# Patient Record
Sex: Female | Born: 1985 | Race: White | Hispanic: No | Marital: Single | Smoking: Never smoker
Health system: Southern US, Community
[De-identification: ages and names within clinical notes are randomized; demographics above are authoritative.]

## PROBLEM LIST (undated history)

## (undated) DIAGNOSIS — Z8719 Personal history of other diseases of the digestive system: Secondary | ICD-10-CM

## (undated) DIAGNOSIS — R112 Nausea with vomiting, unspecified: Secondary | ICD-10-CM

## (undated) DIAGNOSIS — Z9889 Other specified postprocedural states: Secondary | ICD-10-CM

## (undated) DIAGNOSIS — K6289 Other specified diseases of anus and rectum: Secondary | ICD-10-CM

## (undated) DIAGNOSIS — R110 Nausea: Secondary | ICD-10-CM

## (undated) DIAGNOSIS — G8929 Other chronic pain: Secondary | ICD-10-CM

## (undated) DIAGNOSIS — K219 Gastro-esophageal reflux disease without esophagitis: Secondary | ICD-10-CM

## (undated) DIAGNOSIS — K5010 Crohn's disease of large intestine without complications: Secondary | ICD-10-CM

## (undated) DIAGNOSIS — Z973 Presence of spectacles and contact lenses: Secondary | ICD-10-CM

---
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
## 2005-12-02 ENCOUNTER — Encounter: Admission: RE | Admit: 2005-12-02 | Discharge: 2005-12-02 | Payer: Self-pay | Admitting: Family Medicine

## 2009-05-29 ENCOUNTER — Other Ambulatory Visit: Admission: RE | Admit: 2009-05-29 | Discharge: 2009-05-29 | Payer: Self-pay | Admitting: Family Medicine

## 2012-06-05 ENCOUNTER — Other Ambulatory Visit: Payer: Self-pay | Admitting: Family Medicine

## 2012-06-05 DIAGNOSIS — R109 Unspecified abdominal pain: Secondary | ICD-10-CM

## 2012-06-08 ENCOUNTER — Ambulatory Visit
Admission: RE | Admit: 2012-06-08 | Discharge: 2012-06-08 | Disposition: A | Discharge status: Home / Self Care | Payer: Managed Care, Other (non HMO) | Source: Ambulatory Visit | Attending: Family Medicine | Admitting: Family Medicine

## 2012-06-08 DIAGNOSIS — R109 Unspecified abdominal pain: Secondary | ICD-10-CM

## 2012-12-05 ENCOUNTER — Other Ambulatory Visit (HOSPITAL_COMMUNITY)
Admission: RE | Admit: 2012-12-05 | Discharge: 2012-12-05 | Disposition: A | Discharge status: Home / Self Care | Payer: Managed Care, Other (non HMO) | Source: Ambulatory Visit | Attending: Family Medicine | Admitting: Family Medicine

## 2012-12-05 ENCOUNTER — Other Ambulatory Visit: Payer: Self-pay | Admitting: Family Medicine

## 2012-12-05 DIAGNOSIS — Z124 Encounter for screening for malignant neoplasm of cervix: Secondary | ICD-10-CM | POA: Insufficient documentation

## 2012-12-05 DIAGNOSIS — Z1151 Encounter for screening for human papillomavirus (HPV): Secondary | ICD-10-CM | POA: Insufficient documentation

## 2013-08-13 ENCOUNTER — Encounter (HOSPITAL_COMMUNITY): Payer: Self-pay | Admitting: Emergency Medicine

## 2013-08-13 ENCOUNTER — Emergency Department (HOSPITAL_COMMUNITY)
Admission: EM | Admit: 2013-08-13 | Discharge: 2013-08-13 | Disposition: A | Discharge status: Home / Self Care | Payer: Managed Care, Other (non HMO) | Attending: Emergency Medicine | Admitting: Emergency Medicine

## 2013-08-13 DIAGNOSIS — R142 Eructation: Secondary | ICD-10-CM | POA: Insufficient documentation

## 2013-08-13 DIAGNOSIS — R141 Gas pain: Secondary | ICD-10-CM | POA: Insufficient documentation

## 2013-08-13 DIAGNOSIS — R197 Diarrhea, unspecified: Secondary | ICD-10-CM

## 2013-08-13 DIAGNOSIS — Z3202 Encounter for pregnancy test, result negative: Secondary | ICD-10-CM | POA: Insufficient documentation

## 2013-08-13 LAB — CBC WITH DIFFERENTIAL/PLATELET
Basophils Relative: 0 % (ref 0–1)
HCT: 42 % (ref 36.0–46.0)
Hemoglobin: 14.3 g/dL (ref 12.0–15.0)
Lymphs Abs: 2.6 10*3/uL (ref 0.7–4.0)
MCHC: 34 g/dL (ref 30.0–36.0)
Monocytes Absolute: 0.6 10*3/uL (ref 0.1–1.0)
Monocytes Relative: 5 % (ref 3–12)
Neutro Abs: 8.4 10*3/uL — ABNORMAL HIGH (ref 1.7–7.7)
Neutrophils Relative %: 72 % (ref 43–77)
RBC: 4.72 MIL/uL (ref 3.87–5.11)

## 2013-08-13 LAB — COMPREHENSIVE METABOLIC PANEL
Albumin: 3.6 g/dL (ref 3.5–5.2)
Alkaline Phosphatase: 73 U/L (ref 39–117)
BUN: 12 mg/dL (ref 6–23)
CO2: 24 mEq/L (ref 19–32)
Chloride: 101 mEq/L (ref 96–112)
Creatinine, Ser: 0.97 mg/dL (ref 0.50–1.10)
GFR calc Af Amer: >90 mL/min (ref >90)
GFR calc non Af Amer: 80 mL/min — ABNORMAL LOW (ref >90)
Glucose, Bld: 101 mg/dL — ABNORMAL HIGH (ref 70–99)
Potassium: 3.9 mEq/L (ref 3.5–5.1)
Total Bilirubin: 0.3 mg/dL (ref 0.3–1.2)

## 2013-08-13 LAB — URINALYSIS, ROUTINE W REFLEX MICROSCOPIC
Bilirubin Urine: NEGATIVE
Ketones, ur: NEGATIVE mg/dL
Nitrite: NEGATIVE
Protein, ur: NEGATIVE mg/dL
Urobilinogen, UA: 0.2 mg/dL (ref 0.0–1.0)
pH: 5.5 (ref 5.0–8.0)

## 2013-08-13 LAB — LIPASE, BLOOD: Lipase: 20 U/L (ref 11–59)

## 2013-08-13 LAB — URINE MICROSCOPIC-ADD ON

## 2013-08-13 MED ORDER — ONDANSETRON HCL 4 MG/2ML IJ SOLN
4.0000 mg | INTRAMUSCULAR | Status: AC
  Administered 2013-08-13: 4 mg via INTRAVENOUS
  Filled 2013-08-13: qty 2

## 2013-08-13 MED ORDER — DICYCLOMINE HCL 20 MG PO TABS: 20.0000 mg | ORAL_TABLET | Freq: Two times a day (BID) | ORAL | Status: DC

## 2013-08-13 MED ORDER — MORPHINE SULFATE 4 MG/ML IJ SOLN
4.0000 mg | Freq: Once | INTRAMUSCULAR | Status: AC
  Administered 2013-08-13: 4 mg via INTRAVENOUS
  Filled 2013-08-13: qty 1

## 2013-08-13 MED ORDER — SODIUM CHLORIDE 0.9 % IV BOLUS (SEPSIS)
1000.0000 mL | Freq: Once | INTRAVENOUS | Status: AC
  Administered 2013-08-13: 1000 mL via INTRAVENOUS

## 2013-08-13 MED ORDER — ONDANSETRON 4 MG PO TBDP: 4.0000 mg | ORAL_TABLET | Freq: Three times a day (TID) | ORAL | Status: DC | PRN

## 2013-08-13 NOTE — ED Provider Notes (Signed)
CSN: [id number]     Arrival date & time 08/13/13  1019 History   First MD Initiated Contact with Patient 08/13/13 1117     Chief Complaint  Patient presents with  . Diarrhea  . Abdominal Pain   (Consider location/radiation/quality/duration/timing/severity/associated sxs/prior Treatment) The history is provided by the patient and medical records.   This is a 27 year old female with no significant past medical history presenting to the ED for 2 months of cramping abdominal pain, bloating, and diarrhea. Patient states symptoms have worsened over the past week. Patient has seen her PCP and had lab work performed, including stool O&P and culture, but has not resulted yet.  Patient denies any recent travel or antibiotic use.  Has had isolated episodes of nausea and vomiting, but none in the past 2 days. Patient states everything she seems to "run through her". States her stools are very watery with some mucus and blood intermixed.  No gross blood. No history of IBS or Crohn's disease.  Does have family hx of colon CA-- grandmother.  Has upcoming appt with GI on 08/23/13.  No urinary sx, no vaginal complaints.  Patient states she has had an intermittent, low-grade fever, afebrile on arrival 97.40F.  Notes some sweats when nausea comes but no chills.  History reviewed. No pertinent past medical history. History reviewed. No pertinent past surgical history. History reviewed. No pertinent family history. History  Substance Use Topics  . Smoking status: Never Smoker   . Smokeless tobacco: Not on file  . Alcohol Use: Yes     Comment: occ   OB History   Grav Para Term Preterm Abortions TAB SAB Ect Mult Living                 Review of Systems  Gastrointestinal: Positive for abdominal pain and diarrhea.  All other systems reviewed and are negative.    Allergies  Review of patient's allergies indicates no known allergies.  Home Medications  No current outpatient prescriptions on file. BP  130/68  Pulse 100  Temp(Src) 97.8 F (36.6 C) (Oral)  Resp 18  Ht 5\' 6"  (1.676 m)  Wt 190 lb 8 oz (86.41 kg)  BMI 30.76 kg/m2  SpO2 97%  Physical Exam  Nursing note and vitals reviewed. Constitutional: She is oriented to person, place, and time. She appears well-developed and well-nourished. No distress.  HENT:  Head: Normocephalic and atraumatic.  Mouth/Throat: Oropharynx is clear and moist.  Mildly dry mucus membranes  Eyes: Conjunctivae and EOM are normal. Pupils are equal, round, and reactive to light.  Neck: Normal range of motion. Neck supple.  Cardiovascular: Normal rate, regular rhythm and normal heart sounds.   Pulmonary/Chest: Effort normal and breath sounds normal. No respiratory distress. She has no wheezes.  Abdominal: Soft. Bowel sounds are normal. She exhibits no distension. There is tenderness in the epigastric area, left upper quadrant and left lower quadrant. There is no CVA tenderness, no tenderness at McBurney's point and negative Murphy's sign.  Abdomen soft, no distention, no peritoneal signs  Musculoskeletal: Normal range of motion.  Neurological: She is alert and oriented to person, place, and time.  Skin: Skin is warm. She is not diaphoretic.  Psychiatric: She has a normal mood and affect.    ED Course  Procedures (including critical care time) Labs Review Labs Reviewed  CBC WITH DIFFERENTIAL - Abnormal; Notable for the following:    WBC 11.8 (*)    Neutro Abs 8.4 (*)    All other components  within normal limits  COMPREHENSIVE METABOLIC PANEL - Abnormal; Notable for the following:    Glucose, Bld 101 (*)    GFR calc non Af Amer 80 (*)    All other components within normal limits  URINALYSIS, ROUTINE W REFLEX MICROSCOPIC - Abnormal; Notable for the following:    APPearance HAZY (*)    Hgb urine dipstick TRACE (*)    Leukocytes, UA SMALL (*)    All other components within normal limits  URINE MICROSCOPIC-ADD ON - Abnormal; Notable for the  following:    Squamous Epithelial / LPF MANY (*)    Bacteria, UA MANY (*)    All other components within normal limits  URINE CULTURE  LIPASE, BLOOD  POCT PREGNANCY, URINE   Imaging Review No results found.  EKG Interpretation   None       MDM   1. Diarrhea    Labs as above, mild leukocytosis at 11.8, H/H stable.  U/a contaminated, will send for culture.  Sx greatly improved after IVF and pain meds-- pt without any episodes of diarrhea in the ED, tolerating PO without difficulty.  Repeat abdominal exam largely benign without peritoneal signs.  Sx possibly due to colitis, IBS, or Chrohn's disease-- Doubt acute or surgical abdomen at this time.  Pt afebrile, non-toxic appearing, NAD, VS stable- ok for discharge.  Will give trial of bentyl and zofran.  FU with GI as previously scheduled on 08/23/13.   Instructed she may call [facility] for earlier appt-- contact info given.  Discussed plan with pt, she agreed.  Return precautions advised.  Discussed with Dr. Rhunette Croft who agrees with assessment and plan of care.  Garlon Hatchet, PA-C 08/13/13 1356

## 2013-08-13 NOTE — ED Notes (Signed)
Pt c/o abd pain and distention with mucous diarrhea with some blood x 1 week; pt sts seen PCP but no diagnosis or improvement; pt sts has GI appt on 10/30

## 2013-08-13 NOTE — ED Notes (Signed)
Pt discharged.Vital signs stable and GCS 15.Discharge instructions given. 

## 2013-08-14 LAB — URINE CULTURE: Colony Count: 50000

## 2013-08-14 NOTE — ED Provider Notes (Signed)
Medical screening examination/treatment/procedure(s) were performed by non-physician practitioner and as supervising physician I was immediately available for consultation/collaboration.  Derwood Kaplan, MD 08/14/13 1559

## 2013-08-15 NOTE — ED Notes (Signed)
+   Urine No treatment needed per protocol MD. 

## 2013-08-16 ENCOUNTER — Other Ambulatory Visit: Payer: Self-pay | Admitting: Gastroenterology

## 2013-08-16 DIAGNOSIS — R109 Unspecified abdominal pain: Secondary | ICD-10-CM

## 2013-08-21 ENCOUNTER — Ambulatory Visit
Admission: RE | Admit: 2013-08-21 | Discharge: 2013-08-21 | Disposition: A | Discharge status: Home / Self Care | Payer: 59 | Source: Ambulatory Visit | Attending: Gastroenterology | Admitting: Gastroenterology

## 2013-08-21 DIAGNOSIS — R109 Unspecified abdominal pain: Secondary | ICD-10-CM

## 2013-08-21 MED ORDER — IOHEXOL 300 MG/ML  SOLN
100.0000 mL | Freq: Once | INTRAMUSCULAR | Status: AC | PRN
  Administered 2013-08-21: 100 mL via INTRAVENOUS

## 2013-08-25 HISTORY — PX: COLONOSCOPY WITH ESOPHAGOGASTRODUODENOSCOPY (EGD): SHX5779

## 2013-11-26 ENCOUNTER — Ambulatory Visit (HOSPITAL_COMMUNITY)
Admission: RE | Admit: 2013-11-26 | Discharge: 2013-11-26 | Disposition: A | Discharge status: Home / Self Care | Payer: 59 | Source: Ambulatory Visit | Attending: General Surgery | Admitting: General Surgery

## 2013-11-26 ENCOUNTER — Ambulatory Visit (INDEPENDENT_AMBULATORY_CARE_PROVIDER_SITE_OTHER): Payer: 59 | Admitting: General Surgery

## 2013-11-26 ENCOUNTER — Encounter (INDEPENDENT_AMBULATORY_CARE_PROVIDER_SITE_OTHER): Payer: Self-pay | Admitting: General Surgery

## 2013-11-26 ENCOUNTER — Encounter (INDEPENDENT_AMBULATORY_CARE_PROVIDER_SITE_OTHER): Payer: Self-pay

## 2013-11-26 VITALS — BP 126/86 | HR 68 | Temp 98.4°F | Resp 14 | Ht 66.0 in | Wt 187.2 lb

## 2013-11-26 DIAGNOSIS — K6289 Other specified diseases of anus and rectum: Secondary | ICD-10-CM | POA: Insufficient documentation

## 2013-11-26 DIAGNOSIS — K5090 Crohn's disease, unspecified, without complications: Secondary | ICD-10-CM | POA: Insufficient documentation

## 2013-11-26 DIAGNOSIS — K5010 Crohn's disease of large intestine without complications: Secondary | ICD-10-CM

## 2013-11-26 MED ORDER — METRONIDAZOLE 500 MG PO TABS: 500.0000 mg | ORAL_TABLET | Freq: Three times a day (TID) | ORAL | Status: DC

## 2013-11-26 MED ORDER — GADOBENATE DIMEGLUMINE 529 MG/ML IV SOLN
17.0000 mL | Freq: Once | INTRAVENOUS | Status: AC | PRN
  Administered 2013-11-26: 17 mL via INTRAVENOUS

## 2013-11-26 MED ORDER — LIDOCAINE 5 % EX OINT: 1.0000 "application " | TOPICAL_OINTMENT | CUTANEOUS | Status: DC | PRN

## 2013-11-26 NOTE — Progress Notes (Signed)
No chief complaint on file.   HISTORY: Vickie LefortSarah Vang is a 28 y.o. female who presents to the office with anal pain.  Other symptoms include bleeding (blood on TP).  This had been occurring for several weeks.  She has a recent diagnosis of Crohn's by Dr Ewing SchleinMagod.  She has tried sitz baths and steroid cream in the past with some success.  BM's makes the symptoms worse. She has 4-10 BM's a day.  It is continuous in nature.   Her last colonoscopy was in the fall where the Crohn's was identified.  She is on Aprezo (3.75mg ) and Uceris (9mg ).    No past medical history on file.    Past Surgical History  Procedure Laterality Date  . Tonsillectomy       Colonoscopy- ulcerated mucosa throughout entire colon.  TI normal.  Inflamed hemorrhoidal tissue.  Biopsies c/w Crohn's   Current Outpatient Prescriptions  Medication Sig Dispense Refill  . Budesonide (UCERIS) 9 MG TB24 Take by mouth.      Marland Kitchen. buPROPion (WELLBUTRIN XL) 150 MG 24 hr tablet Take 450 mg by mouth daily.      Marland Kitchen. FLUoxetine (PROZAC) 20 MG capsule Take 20 mg by mouth daily.      . mesalamine (APRISO) 0.375 G 24 hr capsule Take 375 mg by mouth daily.      . norethindrone-ethinyl estradiol 1/35 (ALAYCEN 1/35) tablet Take 1 tablet by mouth daily.      Marland Kitchen. omeprazole (PRILOSEC) 40 MG capsule Take 40 mg by mouth daily.       No current facility-administered medications for this visit.      No Known Allergies    Family History  Problem Relation Age of Onset  . Colon cancer Paternal Grandmother   . Ovarian cancer Maternal Grandmother     History   Social History  . Marital Status: Single    Spouse Name: N/A    Number of Children: N/A  . Years of Education: N/A   Social History Main Topics  . Smoking status: Never Smoker   . Smokeless tobacco: None  . Alcohol Use: Yes     Comment: occ  . Drug Use: No  . Sexual Activity: None   Other Topics Concern  . None   Social History Narrative  . None      REVIEW OF SYSTEMS - PERTINENT  POSITIVES ONLY: Review of Systems - General ROS: negative for - chills, fever or weight loss Hematological and Lymphatic ROS: negative for - bleeding problems, blood clots or bruising Respiratory ROS: no cough, shortness of breath, or wheezing Cardiovascular ROS: no chest pain or dyspnea on exertion Gastrointestinal ROS: no abdominal pain, change in bowel habits, or black or bloody stools Genito-Urinary ROS: no dysuria, trouble voiding, or hematuria  EXAM: Filed Vitals:   11/26/13 0950  BP: 126/86  Pulse: 68  Temp: 98.4 F (36.9 C)  Resp: 14    General appearance: alert and cooperative Resp: clear to auscultation bilaterally Cardio: regular rate and rhythm GI: normal findings: soft, non-tender  Anal Exam Findings: Left anterior inflamed skin tag, possible fissure, no abscess identified perianally. Pt with significant pain.  Unable to explain completely with exam findings  ASSESSMENT AND PLAN:  Vickie LefortSarah Vang is a 28 y.o. F with A new diagnosis of Crohn's disease. She presents to the office with 2 weeks of worsening perianal pain. She is having multiple loose bowel movements everyday. She is also having GI pain and cramping. On exam she has a  left anterior inflammatory skin tag and possibly a fissure. She is also complaining of pain deeper inside. I think it is reasonable to perform an MRI to rule out occult fistula or abscess.  I will place her on a course of Flagyl to help with her her anal inflammation. She will discuss her medication regimen with Dr. Ewing Schlein.  I'll see her back in the office in four-week source in her if needed.   Vanita Panda, MD Colon and Rectal Surgery / General Surgery [facility]      Visit Diagnoses: No diagnosis found.  Primary Care Physician: Gweneth Dimitri, MD

## 2013-11-26 NOTE — Patient Instructions (Signed)
We will get the MRI scheduled as soon as possible.  Take the flagyl as directed.  Call the office if you'd like a refill.  Ok to continue to use hemorrhoid cream 3-4 times a day.  Sit in a warm bath after bowel movements.  Keep the area clean and dry.  Use the lidocaine cream as needed to help with pain.

## 2013-11-27 ENCOUNTER — Telehealth (INDEPENDENT_AMBULATORY_CARE_PROVIDER_SITE_OTHER): Payer: Self-pay | Admitting: General Surgery

## 2013-11-27 NOTE — Telephone Encounter (Signed)
Called patient to let her know that she does not have an abscess or fistula. Most likely she has a small fissure that is aggravated by her multiple BM's. Cont current treatment. And her anal pain will get better when the crohn's is in better control. And the patient wants Dr Maisie Fushomas to send a note to Dr Ewing SchleinMagod

## 2013-11-28 NOTE — Telephone Encounter (Signed)
MRI and note sent to Dr Ewing Schlein.  Will be available as needed to help manage her symptoms.

## 2013-12-12 ENCOUNTER — Encounter (INDEPENDENT_AMBULATORY_CARE_PROVIDER_SITE_OTHER): Payer: Self-pay

## 2013-12-21 ENCOUNTER — Other Ambulatory Visit: Payer: Self-pay | Admitting: Gastroenterology

## 2013-12-21 DIAGNOSIS — R109 Unspecified abdominal pain: Secondary | ICD-10-CM

## 2013-12-21 DIAGNOSIS — K6289 Other specified diseases of anus and rectum: Secondary | ICD-10-CM

## 2013-12-24 ENCOUNTER — Ambulatory Visit
Admission: RE | Admit: 2013-12-24 | Discharge: 2013-12-24 | Disposition: A | Discharge status: Home / Self Care | Payer: 59 | Source: Ambulatory Visit | Attending: Gastroenterology | Admitting: Gastroenterology

## 2013-12-24 DIAGNOSIS — K6289 Other specified diseases of anus and rectum: Secondary | ICD-10-CM

## 2013-12-24 DIAGNOSIS — R109 Unspecified abdominal pain: Secondary | ICD-10-CM

## 2013-12-24 MED ORDER — IOHEXOL 300 MG/ML  SOLN
100.0000 mL | Freq: Once | INTRAMUSCULAR | Status: AC | PRN
  Administered 2013-12-24: 100 mL via INTRAVENOUS

## 2013-12-25 ENCOUNTER — Telehealth (INDEPENDENT_AMBULATORY_CARE_PROVIDER_SITE_OTHER): Payer: Self-pay | Admitting: General Surgery

## 2013-12-25 NOTE — Telephone Encounter (Signed)
Called to discuss possible EUA.  Left message.

## 2013-12-25 NOTE — Telephone Encounter (Signed)
Discuss surgery with patient. She is ready to schedule.

## 2013-12-27 ENCOUNTER — Encounter (HOSPITAL_BASED_OUTPATIENT_CLINIC_OR_DEPARTMENT_OTHER): Payer: Self-pay | Admitting: *Deleted

## 2013-12-31 ENCOUNTER — Encounter (HOSPITAL_BASED_OUTPATIENT_CLINIC_OR_DEPARTMENT_OTHER): Payer: Self-pay | Admitting: *Deleted

## 2013-12-31 NOTE — Progress Notes (Signed)
NPO AFTER MN. ARRIVE AT 1610. NEEDS HG AND URINE PREG. WILL TAKE AM MEDS DOS W/ SIPS OF WATER.

## 2014-01-01 ENCOUNTER — Telehealth (INDEPENDENT_AMBULATORY_CARE_PROVIDER_SITE_OTHER): Payer: Self-pay | Admitting: General Surgery

## 2014-01-01 NOTE — Telephone Encounter (Signed)
Calling to state they have no orders surgery tomorrow

## 2014-01-02 ENCOUNTER — Ambulatory Visit (HOSPITAL_BASED_OUTPATIENT_CLINIC_OR_DEPARTMENT_OTHER)
Admission: RE | Admit: 2014-01-02 | Discharge: 2014-01-02 | Disposition: A | Discharge status: Home / Self Care | Payer: 59 | Source: Ambulatory Visit | Attending: General Surgery | Admitting: General Surgery

## 2014-01-02 ENCOUNTER — Encounter (HOSPITAL_BASED_OUTPATIENT_CLINIC_OR_DEPARTMENT_OTHER)
Admission: RE | Disposition: A | Discharge status: Home / Self Care | Payer: Self-pay | Source: Ambulatory Visit | Attending: General Surgery

## 2014-01-02 ENCOUNTER — Ambulatory Visit (HOSPITAL_BASED_OUTPATIENT_CLINIC_OR_DEPARTMENT_OTHER): Payer: 59 | Admitting: Anesthesiology

## 2014-01-02 ENCOUNTER — Encounter (HOSPITAL_BASED_OUTPATIENT_CLINIC_OR_DEPARTMENT_OTHER): Payer: 59 | Admitting: Anesthesiology

## 2014-01-02 ENCOUNTER — Encounter (HOSPITAL_BASED_OUTPATIENT_CLINIC_OR_DEPARTMENT_OTHER): Payer: Self-pay | Admitting: Anesthesiology

## 2014-01-02 DIAGNOSIS — K449 Diaphragmatic hernia without obstruction or gangrene: Secondary | ICD-10-CM | POA: Insufficient documentation

## 2014-01-02 DIAGNOSIS — K626 Ulcer of anus and rectum: Secondary | ICD-10-CM | POA: Insufficient documentation

## 2014-01-02 DIAGNOSIS — K6289 Other specified diseases of anus and rectum: Secondary | ICD-10-CM | POA: Insufficient documentation

## 2014-01-02 DIAGNOSIS — Z79899 Other long term (current) drug therapy: Secondary | ICD-10-CM | POA: Insufficient documentation

## 2014-01-02 DIAGNOSIS — K5090 Crohn's disease, unspecified, without complications: Secondary | ICD-10-CM | POA: Insufficient documentation

## 2014-01-02 DIAGNOSIS — G8929 Other chronic pain: Secondary | ICD-10-CM | POA: Insufficient documentation

## 2014-01-02 DIAGNOSIS — Z8711 Personal history of peptic ulcer disease: Secondary | ICD-10-CM | POA: Insufficient documentation

## 2014-01-02 DIAGNOSIS — K219 Gastro-esophageal reflux disease without esophagitis: Secondary | ICD-10-CM | POA: Insufficient documentation

## 2014-01-02 HISTORY — DX: Other chronic pain: G89.29

## 2014-01-02 HISTORY — DX: Gastro-esophageal reflux disease without esophagitis: K21.9

## 2014-01-02 HISTORY — DX: Crohn's disease of large intestine without complications: K50.10

## 2014-01-02 HISTORY — PX: EXAMINATION UNDER ANESTHESIA: SHX1540

## 2014-01-02 HISTORY — DX: Presence of spectacles and contact lenses: Z97.3

## 2014-01-02 HISTORY — DX: Nausea with vomiting, unspecified: R11.2

## 2014-01-02 HISTORY — DX: Other specified diseases of anus and rectum: K62.89

## 2014-01-02 HISTORY — DX: Other specified postprocedural states: Z98.890

## 2014-01-02 HISTORY — DX: Nausea: R11.0

## 2014-01-02 HISTORY — DX: Personal history of other diseases of the digestive system: Z87.19

## 2014-01-02 LAB — POCT HEMOGLOBIN-HEMACUE: HEMOGLOBIN: 13.6 g/dL (ref 12.0–15.0)

## 2014-01-02 SURGERY — EXAM UNDER ANESTHESIA
Anesthesia: General | Site: Rectum

## 2014-01-02 MED ORDER — TRAMADOL HCL 50 MG PO TABS
ORAL_TABLET | ORAL | Status: AC
  Filled 2014-01-02: qty 2

## 2014-01-02 MED ORDER — KETOROLAC TROMETHAMINE 30 MG/ML IJ SOLN
INTRAMUSCULAR | Status: DC | PRN
  Administered 2014-01-02: 30 mg via INTRAVENOUS

## 2014-01-02 MED ORDER — MIDAZOLAM HCL 5 MG/5ML IJ SOLN
INTRAMUSCULAR | Status: DC | PRN
  Administered 2014-01-02: 2 mg via INTRAVENOUS

## 2014-01-02 MED ORDER — DEXAMETHASONE SODIUM PHOSPHATE 4 MG/ML IJ SOLN
INTRAMUSCULAR | Status: DC | PRN
  Administered 2014-01-02: 10 mg via INTRAVENOUS

## 2014-01-02 MED ORDER — FENTANYL CITRATE 0.05 MG/ML IJ SOLN
INTRAMUSCULAR | Status: AC
  Filled 2014-01-02: qty 6

## 2014-01-02 MED ORDER — ONDANSETRON HCL 4 MG/2ML IJ SOLN
INTRAMUSCULAR | Status: DC | PRN
  Administered 2014-01-02: 4 mg via INTRAVENOUS

## 2014-01-02 MED ORDER — PROMETHAZINE HCL 25 MG/ML IJ SOLN
6.2500 mg | INTRAMUSCULAR | Status: DC | PRN
  Filled 2014-01-02: qty 1

## 2014-01-02 MED ORDER — STERILE WATER FOR IRRIGATION IR SOLN
Status: DC | PRN
  Administered 2014-01-02: 1

## 2014-01-02 MED ORDER — MIDAZOLAM HCL 2 MG/2ML IJ SOLN
INTRAMUSCULAR | Status: AC
  Filled 2014-01-02: qty 2

## 2014-01-02 MED ORDER — SUCCINYLCHOLINE CHLORIDE 20 MG/ML IJ SOLN
INTRAMUSCULAR | Status: DC | PRN
  Administered 2014-01-02: 100 mg via INTRAVENOUS

## 2014-01-02 MED ORDER — FENTANYL CITRATE 0.05 MG/ML IJ SOLN
INTRAMUSCULAR | Status: DC | PRN
  Administered 2014-01-02 (×4): 50 ug via INTRAVENOUS

## 2014-01-02 MED ORDER — PROPOFOL 10 MG/ML IV BOLUS
INTRAVENOUS | Status: DC | PRN
  Administered 2014-01-02: 150 mg via INTRAVENOUS

## 2014-01-02 MED ORDER — FENTANYL CITRATE 0.05 MG/ML IJ SOLN
25.0000 ug | INTRAMUSCULAR | Status: DC | PRN
  Filled 2014-01-02: qty 1

## 2014-01-02 MED ORDER — LACTATED RINGERS IV SOLN
INTRAVENOUS | Status: DC
  Administered 2014-01-02 (×2): via INTRAVENOUS
  Filled 2014-01-02: qty 1000

## 2014-01-02 MED ORDER — TRAMADOL HCL 50 MG PO TABS
100.0000 mg | ORAL_TABLET | Freq: Four times a day (QID) | ORAL | Status: DC | PRN
  Administered 2014-01-02: 100 mg via ORAL

## 2014-01-02 MED ORDER — LIDOCAINE HCL (CARDIAC) 20 MG/ML IV SOLN
INTRAVENOUS | Status: DC | PRN
  Administered 2014-01-02: 50 mg via INTRAVENOUS

## 2014-01-02 MED ORDER — ACETAMINOPHEN 10 MG/ML IV SOLN
INTRAVENOUS | Status: DC | PRN
  Administered 2014-01-02: 1000 mg via INTRAVENOUS

## 2014-01-02 SURGICAL SUPPLY — 59 items
APL SKNCLS STERI-STRIP NONHPOA (GAUZE/BANDAGES/DRESSINGS) ×2
BENZOIN TINCTURE PRP APPL 2/3 (GAUZE/BANDAGES/DRESSINGS) ×4 IMPLANT
BLADE HEX COATED 2.75 (ELECTRODE) ×4 IMPLANT
BLADE SURG 10 STRL SS (BLADE) IMPLANT
BLADE SURG 15 STRL LF DISP TIS (BLADE) ×2 IMPLANT
BLADE SURG 15 STRL SS (BLADE) ×4
BRIEF STRETCH FOR OB PAD LRG (UNDERPADS AND DIAPERS) IMPLANT
CANISTER SUCTION 2500CC (MISCELLANEOUS) IMPLANT
CLOTH BEACON ORANGE TIMEOUT ST (SAFETY) ×4 IMPLANT
COVER MAYO STAND STRL (DRAPES) ×4 IMPLANT
COVER TABLE BACK 60X90 (DRAPES) ×4 IMPLANT
DECANTER SPIKE VIAL GLASS SM (MISCELLANEOUS) IMPLANT
DRAIN PENROSE 18X1/2 LTX STRL (DRAIN) IMPLANT
DRAPE LG THREE QUARTER DISP (DRAPES) IMPLANT
DRAPE PED LAPAROTOMY (DRAPES) ×4 IMPLANT
DRAPE UNDERBUTTOCKS STRL (DRAPE) IMPLANT
ELECT BLADE 6.5 .24CM SHAFT (ELECTRODE) IMPLANT
ELECT REM PT RETURN 9FT ADLT (ELECTROSURGICAL) ×4
ELECTRODE REM PT RTRN 9FT ADLT (ELECTROSURGICAL) ×2 IMPLANT
GAUZE SPONGE 4X4 16PLY XRAY LF (GAUZE/BANDAGES/DRESSINGS) ×4 IMPLANT
GLOVE BIO SURGEON STRL SZ 6.5 (GLOVE) IMPLANT
GLOVE BIO SURGEONS STRL SZ 6.5 (GLOVE)
GLOVE BIOGEL M STRL SZ7.5 (GLOVE) ×8 IMPLANT
GLOVE BIOGEL PI IND STRL 7.0 (GLOVE) ×2 IMPLANT
GLOVE BIOGEL PI IND STRL 7.5 (GLOVE) ×2 IMPLANT
GLOVE BIOGEL PI INDICATOR 7.0 (GLOVE) ×2
GLOVE BIOGEL PI INDICATOR 7.5 (GLOVE) ×2
GLOVE INDICATOR 7.0 STRL GRN (GLOVE) ×4 IMPLANT
GOWN STRL REIN XL XLG (GOWN DISPOSABLE) ×8 IMPLANT
GOWN STRL REUS W/TWL 2XL LVL3 (GOWN DISPOSABLE) IMPLANT
GOWN STRL REUS W/TWL LRG LVL3 (GOWN DISPOSABLE) ×4 IMPLANT
GOWN STRL REUS W/TWL XL LVL3 (GOWN DISPOSABLE) ×4 IMPLANT
HEMOSTAT SURGICEL 2X14 (HEMOSTASIS) ×4 IMPLANT
LEGGING LITHOTOMY PAIR STRL (DRAPES) IMPLANT
LOOP VESSEL MAXI BLUE (MISCELLANEOUS) IMPLANT
NDL SAFETY ECLIPSE 18X1.5 (NEEDLE) IMPLANT
NEEDLE HYPO 18GX1.5 SHARP (NEEDLE)
NEEDLE HYPO 22GX1.5 SAFETY (NEEDLE) IMPLANT
NEEDLE HYPO 25X1 1.5 SAFETY (NEEDLE) ×4 IMPLANT
NS IRRIG 500ML POUR BTL (IV SOLUTION) IMPLANT
PACK BASIN DAY SURGERY FS (CUSTOM PROCEDURE TRAY) ×4 IMPLANT
PAD ABD 8X10 STRL (GAUZE/BANDAGES/DRESSINGS) ×4 IMPLANT
PENCIL BUTTON HOLSTER BLD 10FT (ELECTRODE) ×4 IMPLANT
SPONGE GAUZE 4X4 12PLY (GAUZE/BANDAGES/DRESSINGS) ×4 IMPLANT
SPONGE SURGIFOAM ABS GEL 100 (HEMOSTASIS) ×4 IMPLANT
SPONGE SURGIFOAM ABS GEL 12-7 (HEMOSTASIS) IMPLANT
SUT CHROMIC 2 0 SH (SUTURE) IMPLANT
SUT CHROMIC 3 0 SH 27 (SUTURE) IMPLANT
SUT ETHIBOND 0 (SUTURE) IMPLANT
SUT VIC AB 2-0 SH 27 (SUTURE)
SUT VIC AB 2-0 SH 27XBRD (SUTURE) IMPLANT
SYR CONTROL 10ML LL (SYRINGE) ×4 IMPLANT
TOWEL OR 17X24 6PK STRL BLUE (TOWEL DISPOSABLE) ×8 IMPLANT
TRAY DSU PREP LF (CUSTOM PROCEDURE TRAY) ×4 IMPLANT
TUBE CONNECTING 12'X1/4 (SUCTIONS) ×1
TUBE CONNECTING 12X1/4 (SUCTIONS) ×3 IMPLANT
UNDERPAD 30X30 INCONTINENT (UNDERPADS AND DIAPERS) ×4 IMPLANT
WATER STERILE IRR 500ML POUR (IV SOLUTION) ×4 IMPLANT
YANKAUER SUCT BULB TIP NO VENT (SUCTIONS) ×4 IMPLANT

## 2014-01-02 NOTE — Op Note (Signed)
01/02/2014  10:15 AM  PATIENT:  Vickie Vang  28 y.o. female  Patient Care Team: Gweneth Dimitri, MD as PCP - General (Family Medicine)  PRE-OPERATIVE DIAGNOSIS:  anal pain, crohns disease  POST-OPERATIVE DIAGNOSIS:  anal ulcerations and erythema, crohns disease  PROCEDURE:  EXAM UNDER ANESTHESIA, RIGID PROCTOSCOPY  SURGEON:  Surgeon(s): Romie Levee, MD  ASSISTANT: none   ANESTHESIA:   general  SPECIMEN:  No Specimen  DISPOSITION OF SPECIMEN:  N/A  COUNTS:  YES  PLAN OF CARE: Discharge to home after PACU  PATIENT DISPOSITION:  PACU - hemodynamically stable.  INDICATION: This is a 28 year old female who has a recent diagnosis of Crohn's disease and continues to have abdominal cramping and pain with bowel movements. She is also complaining of anal pain with bowel movements. We are here today to evaluate her anal pain further, Given that her symptoms have not resolved with medical therapy.   OR FINDINGS: 2 areas of anal ulceration with mild bleeding, no fissure or sphincter hypertension, Rectum with mild to moderate inflammation distally, significant purulence and ulceration noted in the distal rectum as well  DESCRIPTION: the patient was identified in the preoperative holding area and taken to the OR where they were laid on the operating room table.  General anesthesia was induced without difficulty. The patient was then positioned in prone jackknife position with buttocks gently taped apart.  The patient was then prepped and draped in usual sterile fashion.  SCDs were noted to be in place prior to the initiation of anesthesia. A surgical timeout was performed indicating the correct patient, procedure, positioning and need for preoperative antibiotics.  I began with a digital rectal exam.  There were no abnormal masses. She does have a left anterior skin tag that appears non-inflamed.  I then placed a Hill-Ferguson anoscope into the anal canal and evaluated this completely.   There was a small ulceration noted in the left posterior region of her anal canal. There was another small ulceration noted anteriorly in the anal canal and one in the right anterior distal rectum. I decided to perform a rigid proctoscopy given that she did have some ulcerations noted. The scope was advanced approximately 15 cm without difficulty. The entire rectum was evaluated.  The proximal rectum appeared slightly erythematous and hyperemic. The distal rectum appeared more inflamed with several small ulcerations and bleeding areas.  There was some purulence noted in the distal rectum as well.  After this was completed the exam was ended. The patient was awakened from anesthesia and sent to the post anesthesia care unit in stable condition. All counts were correct per operating room staff.

## 2014-01-02 NOTE — Discharge Instructions (Signed)

## 2014-01-02 NOTE — Anesthesia Procedure Notes (Signed)
Procedure Name: Intubation Date/Time: 01/02/2014 9:56 AM Performed by: Renella Cunas D Pre-anesthesia Checklist: Patient identified, Emergency Drugs available, Suction available and Patient being monitored Patient Re-evaluated:Patient Re-evaluated prior to inductionOxygen Delivery Method: Circle System Utilized Preoxygenation: Pre-oxygenation with 100% oxygen Intubation Type: IV induction Ventilation: Mask ventilation without difficulty Laryngoscope Size: Mac and 3 Grade View: Grade I Tube type: Oral Tube size: 7.0 mm Number of attempts: 1 Airway Equipment and Method: stylet and oral airway Placement Confirmation: ETT inserted through vocal cords under direct vision,  positive ETCO2 and breath sounds checked- equal and bilateral Tube secured with: Tape Dental Injury: Teeth and Oropharynx as per pre-operative assessment

## 2014-01-02 NOTE — Transfer of Care (Signed)
Immediate Anesthesia Transfer of Care Note  Patient: Vickie Vang  Procedure(s) Performed: Procedure(s) (LRB): EXAM UNDER ANESTHESIA, RIGID PROCTOSCOPY (N/A)  Patient Location: PACU  Anesthesia Type: General  Level of Consciousness: awake, oriented, sedated and patient cooperative  Airway & Oxygen Therapy: Patient Spontanous Breathing and Patient connected to face mask oxygen  Post-op Assessment: Report given to PACU RN and Post -op Vital signs reviewed and stable  Post vital signs: Reviewed and stable  Complications: No apparent anesthesia complications

## 2014-01-02 NOTE — Anesthesia Postprocedure Evaluation (Signed)
  Anesthesia Post-op Note  Patient: Vickie Vang  Procedure(s) Performed: Procedure(s) (LRB): EXAM UNDER ANESTHESIA, RIGID PROCTOSCOPY (N/A)  Patient Location: PACU  Anesthesia Type: General  Level of Consciousness: awake and alert   Airway and Oxygen Therapy: Patient Spontanous Breathing  Post-op Pain: mild  Post-op Assessment: Post-op Vital signs reviewed, Patient's Cardiovascular Status Stable, Respiratory Function Stable, Patent Airway and No signs of Nausea or vomiting  Last Vitals:  Filed Vitals:   01/02/14 1027  BP: 134/73  Pulse: 101  Temp: 37.1 C  Resp: 22    Post-op Vital Signs: stable   Complications: No apparent anesthesia complications

## 2014-01-02 NOTE — H&P (Signed)
HISTORY: Vickie Vang is a 28 y.o. female who presents to the office with anal pain. Other symptoms include bleeding (blood on TP). This had been occurring for several weeks. She has a recent diagnosis of Crohn's by Dr Ewing Schlein. She has tried sitz baths and steroid cream in the past with some success. BM's makes the symptoms worse. She has 4-10 BM's a day. It is continuous in nature. Her last colonoscopy was in the fall where the Crohn's was identified. She is on Aprezo (3.75mg ) and Uceris (9mg ).  She has been on a round of high dose steroids with no change in her anal symptoms.  MRI shows no fistula or abscess.   Past Medical History  Diagnosis Date  . GERD (gastroesophageal reflux disease)   . Crohn's disease of perianal region   . Crohn's colitis   . H/O hiatal hernia   . Wears contact lenses   . Rectal pain, chronic   . Nausea   . History of gastrointestinal ulcer   . PONV (postoperative nausea and vomiting)     Past Surgical History   Procedure  Laterality  Date   .  Tonsillectomy     Colonoscopy- ulcerated mucosa throughout entire colon. TI normal. Inflamed hemorrhoidal tissue. Biopsies c/w Crohn's  Current Outpatient Prescriptions   Medication  Sig  Dispense  Refill   .  Budesonide (UCERIS) 9 MG TB24  Take by mouth.     Marland Kitchen  buPROPion (WELLBUTRIN XL) 150 MG 24 hr tablet  Take 450 mg by mouth daily.     Marland Kitchen  FLUoxetine (PROZAC) 20 MG capsule  Take 20 mg by mouth daily.     .  mesalamine (APRISO) 0.375 G 24 hr capsule  Take 375 mg by mouth daily.     .  norethindrone-ethinyl estradiol 1/35 (ALAYCEN 1/35) tablet  Take 1 tablet by mouth daily.     Marland Kitchen  omeprazole (PRILOSEC) 40 MG capsule  Take 40 mg by mouth daily.      No current facility-administered medications for this visit.   No Known Allergies  Family History   Problem  Relation  Age of Onset   .  Colon cancer  Paternal Grandmother    .  Ovarian cancer  Maternal Grandmother     History    Social History   .  Marital Status:   Single     Spouse Name:  N/A     Number of Children:  N/A   .  Years of Education:  N/A    Social History Main Topics   .  Smoking status:  Never Smoker   .  Smokeless tobacco:  None   .  Alcohol Use:  Yes      Comment: occ   .  Drug Use:  No   .  Sexual Activity:  None    Other Topics  Concern   .  None    Social History Narrative   .  None   REVIEW OF SYSTEMS - PERTINENT POSITIVES ONLY:  Review of Systems - General ROS: negative for - chills, fever or weight loss  Hematological and Lymphatic ROS: negative for - bleeding problems, blood clots or bruising  Respiratory ROS: no cough, shortness of breath, or wheezing  Cardiovascular ROS: no chest pain or dyspnea on exertion  Gastrointestinal ROS: no abdominal pain, change in bowel habits, or black or bloody stools  Genito-Urinary ROS: no dysuria, trouble voiding, or hematuria  EXAM:  Filed Vitals:   01/02/14 0321  BP: 113/76  Pulse: 84  Temp: 97.8 F (36.6 C)  Resp: 16   General appearance: alert and cooperative  Resp: clear to auscultation bilaterally  Cardio: regular rate and rhythm  GI: normal findings: soft, non-tender    ASSESSMENT AND PLAN:  Vickie Vang is a 28 y.o. F with A new diagnosis of Crohn's disease and anal pain.  She has failed medical therapy and is here today for an anal EUA, possible chemical sphincterotomy.  We discussed that we cannot do any major procedures due to her Crohn's disease.  We discussed that there is a possibility that a chemical spincterotomy with cause temporary leakage.      Vanita PandaAlicia C Crickett Abbett, MD  Colon and Rectal Surgery / General Surgery  [facility]

## 2014-01-02 NOTE — Anesthesia Preprocedure Evaluation (Addendum)
Anesthesia Evaluation  Patient identified by MRN, date of birth, ID band Patient awake    Reviewed: Allergy & Precautions, H&P , NPO status , Patient's Chart, lab work & pertinent test results  History of Anesthesia Complications (+) PONV and history of anesthetic complications  Airway Mallampati: II TM Distance: >3 FB Neck ROM: Full    Dental no notable dental hx.    Pulmonary neg pulmonary ROS,  breath sounds clear to auscultation  Pulmonary exam normal       Cardiovascular Exercise Tolerance: Good negative cardio ROS  Rhythm:Regular Rate:Normal     Neuro/Psych negative neurological ROS  negative psych ROS   GI/Hepatic Neg liver ROS, hiatal hernia, GERD-  Medicated,  Endo/Other  negative endocrine ROS  Renal/GU negative Renal ROS  negative genitourinary   Musculoskeletal negative musculoskeletal ROS (+)   Abdominal   Peds negative pediatric ROS (+)  Hematology negative hematology ROS (+)   Anesthesia Other Findings   Reproductive/Obstetrics negative OB ROS Late entry. Preoperatively, She couldn't void for pregnancy test, but she denies the possibility of pregnancy.                          Anesthesia Physical Anesthesia Plan  ASA: II  Anesthesia Plan: General   Post-op Pain Management:    Induction: Intravenous  Airway Management Planned: Oral ETT  Additional Equipment:   Intra-op Plan:   Post-operative Plan: Extubation in OR  Informed Consent: I have reviewed the patients History and Physical, chart, labs and discussed the procedure including the risks, benefits and alternatives for the proposed anesthesia with the patient or authorized representative who has indicated his/her understanding and acceptance.   Dental advisory given  Plan Discussed with: CRNA  Anesthesia Plan Comments:         Anesthesia Quick Evaluation

## 2014-01-03 ENCOUNTER — Encounter (HOSPITAL_BASED_OUTPATIENT_CLINIC_OR_DEPARTMENT_OTHER): Payer: Self-pay | Admitting: General Surgery

## 2014-01-04 ENCOUNTER — Encounter (HOSPITAL_COMMUNITY): Payer: Self-pay | Admitting: Emergency Medicine

## 2014-01-04 ENCOUNTER — Emergency Department (HOSPITAL_COMMUNITY): Payer: 59

## 2014-01-04 ENCOUNTER — Emergency Department (HOSPITAL_COMMUNITY)
Admission: EM | Admit: 2014-01-04 | Discharge: 2014-01-05 | Disposition: A | Discharge status: Home / Self Care | Payer: 59 | Attending: Emergency Medicine | Admitting: Emergency Medicine

## 2014-01-04 DIAGNOSIS — Z9889 Other specified postprocedural states: Secondary | ICD-10-CM | POA: Insufficient documentation

## 2014-01-04 DIAGNOSIS — K5090 Crohn's disease, unspecified, without complications: Secondary | ICD-10-CM

## 2014-01-04 DIAGNOSIS — G8929 Other chronic pain: Secondary | ICD-10-CM | POA: Insufficient documentation

## 2014-01-04 DIAGNOSIS — K644 Residual hemorrhoidal skin tags: Secondary | ICD-10-CM | POA: Insufficient documentation

## 2014-01-04 DIAGNOSIS — K2970 Gastritis, unspecified, without bleeding: Secondary | ICD-10-CM

## 2014-01-04 DIAGNOSIS — Z8711 Personal history of peptic ulcer disease: Secondary | ICD-10-CM | POA: Insufficient documentation

## 2014-01-04 DIAGNOSIS — IMO0002 Reserved for concepts with insufficient information to code with codable children: Secondary | ICD-10-CM | POA: Insufficient documentation

## 2014-01-04 DIAGNOSIS — Z3202 Encounter for pregnancy test, result negative: Secondary | ICD-10-CM | POA: Insufficient documentation

## 2014-01-04 DIAGNOSIS — K5010 Crohn's disease of large intestine without complications: Secondary | ICD-10-CM | POA: Insufficient documentation

## 2014-01-04 DIAGNOSIS — N390 Urinary tract infection, site not specified: Secondary | ICD-10-CM | POA: Insufficient documentation

## 2014-01-04 DIAGNOSIS — Z79899 Other long term (current) drug therapy: Secondary | ICD-10-CM | POA: Insufficient documentation

## 2014-01-04 DIAGNOSIS — K2990 Gastroduodenitis, unspecified, without bleeding: Principal | ICD-10-CM

## 2014-01-04 DIAGNOSIS — L080 Pyoderma: Secondary | ICD-10-CM | POA: Insufficient documentation

## 2014-01-04 DIAGNOSIS — K219 Gastro-esophageal reflux disease without esophagitis: Secondary | ICD-10-CM | POA: Insufficient documentation

## 2014-01-04 LAB — URINALYSIS, ROUTINE W REFLEX MICROSCOPIC
BILIRUBIN URINE: NEGATIVE
Glucose, UA: NEGATIVE mg/dL
HGB URINE DIPSTICK: NEGATIVE
Ketones, ur: NEGATIVE mg/dL
NITRITE: POSITIVE — AB
PH: 8 (ref 5.0–8.0)
Protein, ur: NEGATIVE mg/dL
Specific Gravity, Urine: 1.024 (ref 1.005–1.030)
Urobilinogen, UA: 0.2 mg/dL (ref 0.0–1.0)

## 2014-01-04 LAB — CBC WITH DIFFERENTIAL/PLATELET
Basophils Absolute: 0 10*3/uL (ref 0.0–0.1)
Basophils Relative: 0 % (ref 0–1)
EOS ABS: 0 10*3/uL (ref 0.0–0.7)
Eosinophils Relative: 0 % (ref 0–5)
HCT: 40.2 % (ref 36.0–46.0)
HEMOGLOBIN: 13.7 g/dL (ref 12.0–15.0)
LYMPHS ABS: 1.5 10*3/uL (ref 0.7–4.0)
Lymphocytes Relative: 7 % — ABNORMAL LOW (ref 12–46)
MCH: 29.5 pg (ref 26.0–34.0)
MCHC: 34.1 g/dL (ref 30.0–36.0)
MCV: 86.5 fL (ref 78.0–100.0)
MONOS PCT: 7 % (ref 3–12)
Monocytes Absolute: 1.5 10*3/uL — ABNORMAL HIGH (ref 0.1–1.0)
NEUTROS ABS: 17.1 10*3/uL — AB (ref 1.7–7.7)
Neutrophils Relative %: 85 % — ABNORMAL HIGH (ref 43–77)
Platelets: 397 10*3/uL (ref 150–400)
RBC: 4.65 MIL/uL (ref 3.87–5.11)
RDW: 13.7 % (ref 11.5–15.5)
WBC: 20.1 10*3/uL — ABNORMAL HIGH (ref 4.0–10.5)

## 2014-01-04 LAB — LIPASE, BLOOD: Lipase: 20 U/L (ref 11–59)

## 2014-01-04 LAB — BASIC METABOLIC PANEL
BUN: 12 mg/dL (ref 6–23)
CO2: 25 meq/L (ref 19–32)
Calcium: 9.7 mg/dL (ref 8.4–10.5)
Chloride: 96 mEq/L (ref 96–112)
Creatinine, Ser: 0.96 mg/dL (ref 0.50–1.10)
GFR calc non Af Amer: 80 mL/min — ABNORMAL LOW (ref >90)
GLUCOSE: 122 mg/dL — AB (ref 70–99)
POTASSIUM: 4.2 meq/L (ref 3.7–5.3)
Sodium: 138 mEq/L (ref 137–147)

## 2014-01-04 LAB — URINE MICROSCOPIC-ADD ON

## 2014-01-04 LAB — HEPATIC FUNCTION PANEL
ALK PHOS: 65 U/L (ref 39–117)
ALT: 26 U/L (ref 0–35)
AST: 10 U/L (ref 0–37)
Albumin: 3.2 g/dL — ABNORMAL LOW (ref 3.5–5.2)
BILIRUBIN TOTAL: 0.2 mg/dL — AB (ref 0.3–1.2)
Bilirubin, Direct: <0.2 mg/dL (ref 0.0–0.3)
Total Protein: 6.9 g/dL (ref 6.0–8.3)

## 2014-01-04 LAB — POC URINE PREG, ED: Preg Test, Ur: NEGATIVE

## 2014-01-04 MED ORDER — FENTANYL CITRATE 0.05 MG/ML IJ SOLN
50.0000 ug | Freq: Once | INTRAMUSCULAR | Status: AC
  Administered 2014-01-04: 50 ug via INTRAMUSCULAR
  Filled 2014-01-04: qty 2

## 2014-01-04 MED ORDER — IOHEXOL 300 MG/ML  SOLN
100.0000 mL | Freq: Once | INTRAMUSCULAR | Status: AC | PRN
  Administered 2014-01-04: 100 mL via INTRAVENOUS

## 2014-01-04 MED ORDER — ONDANSETRON HCL 4 MG/2ML IJ SOLN
INTRAMUSCULAR | Status: AC
  Filled 2014-01-04: qty 2

## 2014-01-04 MED ORDER — IOHEXOL 300 MG/ML  SOLN
25.0000 mL | Freq: Once | INTRAMUSCULAR | Status: AC | PRN
  Administered 2014-01-04: 25 mL via ORAL

## 2014-01-04 MED ORDER — ONDANSETRON 4 MG PO TBDP
8.0000 mg | ORAL_TABLET | Freq: Once | ORAL | Status: AC
  Administered 2014-01-04: 8 mg via ORAL
  Filled 2014-01-04: qty 2

## 2014-01-04 MED ORDER — HYDROMORPHONE HCL PF 1 MG/ML IJ SOLN
1.0000 mg | Freq: Once | INTRAMUSCULAR | Status: AC
  Administered 2014-01-04: 1 mg via INTRAVENOUS
  Filled 2014-01-04: qty 1

## 2014-01-04 MED ORDER — METOCLOPRAMIDE HCL 5 MG/ML IJ SOLN
10.0000 mg | Freq: Once | INTRAMUSCULAR | Status: AC
  Administered 2014-01-04: 10 mg via INTRAVENOUS
  Filled 2014-01-04: qty 2

## 2014-01-04 MED ORDER — ONDANSETRON HCL 4 MG/2ML IJ SOLN
4.0000 mg | Freq: Once | INTRAMUSCULAR | Status: AC
  Administered 2014-01-04: 4 mg via INTRAVENOUS

## 2014-01-04 MED ORDER — DIPHENHYDRAMINE HCL 50 MG/ML IJ SOLN
25.0000 mg | Freq: Once | INTRAMUSCULAR | Status: AC
  Administered 2014-01-04: 25 mg via INTRAVENOUS
  Filled 2014-01-04: qty 1

## 2014-01-04 MED ORDER — DEXTROSE 5 % IV SOLN
1.0000 g | Freq: Once | INTRAVENOUS | Status: DC
  Filled 2014-01-04: qty 10

## 2014-01-04 MED ORDER — FENTANYL CITRATE 0.05 MG/ML IJ SOLN: 25.0000 ug | Freq: Once | INTRAMUSCULAR | Status: DC

## 2014-01-04 NOTE — ED Notes (Signed)
Patient finished contrast and CT notified.

## 2014-01-04 NOTE — ED Notes (Signed)
The pt was seen at gi office today for blood work in preparation for iv meds regarding crohns diaseae.  She was not seen by her doctor

## 2014-01-04 NOTE — ED Notes (Signed)
The pt has  chrons disease pain since November.  She had an exam  Under anesthesia yesterday and her pain has been worse since then.  abd cramps chills and hurting all over her body since then.  lmp feb 17th

## 2014-01-04 NOTE — ED Notes (Signed)
Pt actively vomiting in triage 

## 2014-01-04 NOTE — ED Provider Notes (Signed)
CSN: [id number]     Arrival date & time 01/04/14  1703 History   First MD Initiated Contact with Patient 01/04/14 1952     Chief Complaint  Patient presents with  . Abdominal Pain     (Consider location/radiation/quality/duration/timing/severity/associated sxs/prior Treatment) Patient is a 28 y.o. female presenting with abdominal pain. The history is provided by the patient.  Abdominal Pain Pain location:  Generalized Pain quality: sharp   Pain radiates to:  Does not radiate Pain severity:  Severe Onset quality:  Gradual Duration:  2 days Timing:  Constant Progression:  Worsening Chronicity:  Recurrent (pt with hx of Crohn's dz, she states flared up) Context: not alcohol use, not eating, not medication withdrawal, not previous surgeries, not recent travel and not trauma   Relieved by:  Nothing Worsened by:  Palpation Ineffective treatments: prescription meds. Associated symptoms: anorexia, nausea and vomiting   Associated symptoms: no chest pain, no constipation, no cough, no diarrhea, no dysuria, no fever, no hematemesis, no hematochezia, no hematuria, no melena, no shortness of breath, no vaginal bleeding and no vaginal discharge   Risk factors: no alcohol abuse, not elderly, not obese and no recent hospitalization   Risk factors comment:  Hx of Crohns   Past Medical History  Diagnosis Date  . GERD (gastroesophageal reflux disease)   . Crohn's disease of perianal region   . Crohn's colitis   . H/O hiatal hernia   . Wears contact lenses   . Rectal pain, chronic   . Nausea   . History of gastrointestinal ulcer   . PONV (postoperative nausea and vomiting)    Past Surgical History  Procedure Laterality Date  . Tonsillectomy and adenoidectomy  1996  . Colonoscopy with esophagogastroduodenoscopy (egd)  NOV 2014  . Examination under anesthesia N/A 01/02/2014    Procedure: EXAM UNDER ANESTHESIA, RIGID PROCTOSCOPY;  Surgeon: Romie Levee, MD;  Location: [facility];  Service: General;  Laterality: N/A;   Family History  Problem Relation Age of Onset  . Colon cancer Paternal Grandmother   . Ovarian cancer Maternal Grandmother    History  Substance Use Topics  . Smoking status: Never Smoker   . Smokeless tobacco: Never Used  . Alcohol Use: Yes     Comment: occ   OB History   Grav Para Term Preterm Abortions TAB SAB Ect Mult Living                 Review of Systems  Constitutional: Negative for fever, activity change and appetite change.  HENT: Negative for congestion and rhinorrhea.   Eyes: Negative for discharge, redness and itching.  Respiratory: Negative for cough, shortness of breath and wheezing.   Cardiovascular: Negative for chest pain.  Gastrointestinal: Positive for nausea, vomiting, abdominal pain and anorexia. Negative for diarrhea, constipation, melena, hematochezia and hematemesis.  Genitourinary: Negative for dysuria, hematuria, vaginal bleeding, vaginal discharge and difficulty urinating.  Skin: Negative for rash.  Neurological: Negative for syncope.      Allergies  Review of patient's allergies indicates no known allergies.  Home Medications   Current Outpatient Rx  Name  Route  Sig  Dispense  Refill  . dicyclomine (BENTYL) 20 MG tablet   Oral   Take 20 mg by mouth 2 (two) times daily.         . mesalamine (APRISO) 0.375 G 24 hr capsule   Oral   Take 375 mg by mouth every morning.          Marland Kitchen  norethindrone-ethinyl estradiol 1/35 (ALAYCEN 1/35) tablet   Oral   Take 1 tablet by mouth every morning.          Marland Kitchen. omeprazole (PRILOSEC) 40 MG capsule   Oral   Take 40 mg by mouth every morning.          . ondansetron (ZOFRAN) 4 MG tablet   Oral   Take 4 mg by mouth every 12 (twelve) hours.          . predniSONE (DELTASONE) 20 MG tablet   Oral   Take 40 mg by mouth daily with breakfast.         . HYDROcodone-acetaminophen (NORCO) 7.5-325 MG per tablet   Oral   Take 1 tablet by mouth every 6  (six) hours as needed for moderate pain.   30 tablet   0   . ondansetron (ZOFRAN) 4 MG tablet   Oral   Take 1 tablet (4 mg total) by mouth every 6 (six) hours.   30 tablet   0   . sulfamethoxazole-trimethoprim (BACTRIM DS) 800-160 MG per tablet   Oral   Take 1 tablet by mouth 2 (two) times daily.   14 tablet   0   . traMADol (ULTRAM) 50 MG tablet   Oral   Take 100 mg by mouth every 6 (six) hours as needed for moderate pain.           BP 111/75  Pulse 71  Temp(Src) 98.2 F (36.8 C) (Oral)  Resp 16  Ht 5\' 6"  (1.676 m)  Wt 187 lb 12.8 oz (85.186 kg)  BMI 30.33 kg/m2  SpO2 98%  LMP 12/11/2013 Physical Exam  Constitutional: She is oriented to person, place, and time. She appears well-developed and well-nourished. No distress.  Pt in large amount of pain, appears uncomfortable  HENT:  Head: Normocephalic and atraumatic.  Mouth/Throat: Oropharynx is clear and moist.  Eyes: Conjunctivae and EOM are normal. Pupils are equal, round, and reactive to light. Right eye exhibits no discharge. Left eye exhibits no discharge. No scleral icterus.  Neck: Normal range of motion. Neck supple.  Cardiovascular: Normal rate, regular rhythm and intact distal pulses.  Exam reveals no gallop and no friction rub.   No murmur heard. Pulmonary/Chest: Effort normal and breath sounds normal. No respiratory distress. She has no wheezes. She has no rales.  Abdominal: Soft. She exhibits no distension and no mass. There is tenderness (diffuse abd ttp, worse in periumbilical).  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  Neurological: She is alert and oriented to person, place, and time. No cranial nerve deficit. She exhibits normal muscle tone.  Coordination normal.  Skin: She is not diaphoretic.    ED Course  Procedures (including critical care time) Labs Review Labs Reviewed  BASIC METABOLIC PANEL - Abnormal; Notable for the following:    Glucose, Bld 122 (*)    GFR calc non Af Amer 80 (*)    All other components within normal limits  URINALYSIS, ROUTINE W REFLEX MICROSCOPIC - Abnormal; Notable for the following:    APPearance CLOUDY (*)    Nitrite POSITIVE (*)    Leukocytes, UA SMALL (*)    All other components within normal limits  CBC WITH DIFFERENTIAL - Abnormal; Notable for  the following:    WBC 20.1 (*)    Neutrophils Relative % 85 (*)    Neutro Abs 17.1 (*)    Lymphocytes Relative 7 (*)    Monocytes Absolute 1.5 (*)    All other components within normal limits  HEPATIC FUNCTION PANEL - Abnormal; Notable for the following:    Albumin 3.2 (*)    Total Bilirubin 0.2 (*)    All other components within normal limits  URINE MICROSCOPIC-ADD ON - Abnormal; Notable for the following:    Squamous Epithelial / LPF FEW (*)    Bacteria, UA MANY (*)    All other components within normal limits  LIPASE, BLOOD  POC URINE PREG, ED   Imaging Review Ct Abdomen Pelvis W Contrast  01/05/2014   CLINICAL DATA:  Abdominal pain, history of Crohn's disease, proctoscopy yesterday, now with worsening abdominal pain.  EXAM: CT ABDOMEN AND PELVIS WITH CONTRAST  TECHNIQUE: Multidetector CT imaging of the abdomen and pelvis was performed using the standard protocol following bolus administration of intravenous contrast.  CONTRAST:  OMNIPAQUE IOHEXOL 300 MG/ML  SOLN  COMPARISON:  CT ABD/PELVIS W CM dated 12/24/2013; MR PELVIS WO/W CM dated 11/26/2013  FINDINGS: Included view of the lung bases are clear. Visualized heart and pericardium are unremarkable.  The liver, spleen, gallbladder, pancreas and adrenal glands are unremarkable.  Mild circumferential gastric wall thickening, though the stomach is relatively under distended. Small and large  bowel are normal in course and caliber without inflammatory changes. No residual transverse colon wall thickening Minimal free fluid in the pelvis again seen, this may be physiologic. The appendix is not discretely identified, however there are no inflammatory changes in the right lower quadrant.  Kidneys are orthotopic, demonstrating symmetric enhancement without nephrolithiasis, hydronephrosis or renal masses. The unopacified ureters are normal in course and caliber. Delayed imaging through the kidneys demonstrates symmetric prompt excretion to the proximal urinary collecting system. Urinary bladder is partially distended and unremarkable.  Great vessels are normal in course and caliber. Sub cm lymph nodes in the right lower quadrant are unchanged and may be reactive. . Internal reproductive organs are unremarkable. The soft tissues and included osseous structures are nonsuspicious. Subcentimeter sclerotic lesion in left iliac bone may reflect bone island.  IMPRESSION: Mild gastric wall thickening though, the stomach is relatively decompressed. This could reflect gastritis or patient's Crohn's disease or, possibly overestimated by under distention. No residual transverse colon wall thickening on this examination.  Stable trace free fluid in the pelvis which is likely to be physiologic. No bowel obstruction or bowel perforation. No abscess.   Electronically Signed   By: Awilda Metro   On: 01/05/2014 00:06     EKG Interpretation None      MDM   MDM: 27 y.o.w/ Crohn's comes in for abd pain for 2 days. Pt states diffuse abd pain, nbnb vomiting, diarrhea, denies f/c, chest pain, SOB. Hx of similar. On Humera and Prednisone. AFVSS here. Large amount of pain. Abd diffusely tender. Labs drawn, has large leukocytosis, otherwise negative. Given Dilaudid and Zofran for sxs. Will check CT scan of abdomen to r/o acute abdomen. CT scan with no acute surgical pathology, shwos signs of Crohn's gastritis. Pt  symptomatically treated with pain meds and Zofran and fluids, well feeling. Discussed case with [facility] GI physician on call who agrees with management and instructs patient to call office on Monday to schedule follow up. Pt remained HDS while in ED, pain controlled. Given rx for Norco,  Zofran. Urine shows UTI, given Rocephin here, Bactrim rx. Discharged. Care of case d/w my attending.  Final diagnoses:  Crohn disease  Gastritis  UTI (urinary tract infection)   Discharged      Pilar Jarvis, MD 01/05/14 [phone]

## 2014-01-05 MED ORDER — HYDROMORPHONE HCL PF 1 MG/ML IJ SOLN
1.0000 mg | Freq: Once | INTRAMUSCULAR | Status: AC
  Administered 2014-01-05: 1 mg via INTRAVENOUS
  Filled 2014-01-05: qty 1

## 2014-01-05 MED ORDER — ONDANSETRON HCL 4 MG PO TABS: 4.0000 mg | ORAL_TABLET | Freq: Four times a day (QID) | ORAL | Status: AC

## 2014-01-05 MED ORDER — CEFTRIAXONE SODIUM 1 G IJ SOLR
1.0000 g | Freq: Once | INTRAMUSCULAR | Status: AC
  Administered 2014-01-05: 1 g via INTRAVENOUS

## 2014-01-05 MED ORDER — HYDROCODONE-ACETAMINOPHEN 7.5-325 MG PO TABS: 1.0000 | ORAL_TABLET | Freq: Four times a day (QID) | ORAL | Status: AC | PRN

## 2014-01-05 MED ORDER — SULFAMETHOXAZOLE-TMP DS 800-160 MG PO TABS: 1.0000 | ORAL_TABLET | Freq: Two times a day (BID) | ORAL | Status: AC

## 2014-01-05 NOTE — Discharge Instructions (Signed)
Crohn's Disease Crohn's disease is a long-term (chronic) soreness and redness (inflammation) of the intestines (bowel). It can affect any portion of the digestive tract, from the mouth to the anus. It can also cause problems outside the digestive tract. Crohn's disease is closely related to a disease called ulcerative colitis (together, these two diseases are called inflammatory bowel disease).  CAUSES  The cause of Crohn's disease is not known. One theory is that, in an easily affected person, the immune system is triggered to attack the body's own digestive tissue. Crohn's disease runs in families. It seems to be more common in certain geographic areas and amongst certain races. There are no clear-cut dietary causes.  SYMPTOMS  Crohn's disease can cause many different symptoms since it can affect many different parts of the body. Symptoms include:  Fatigue.  Weight loss.  Chronic diarrhea, sometime bloody.  Abdominal pain and cramps.  Fever.  Ulcers or canker sores in the mouth or rectum.  Anemia (low red blood cells).  Arthritis, skin problems, and eye problems may occur. Complications of Crohn's disease can include:  Series of holes (perforation) of the bowel.  Portions of the intestines sticking to each other (adhesions).  Obstruction of the bowel.  Fistula formation, typically in the rectal area but also in other areas. A fistula is an opening between the bowels and the outside, or between the bowels and another organ.  A painful crack in the mucous membrane of the anus (rectal fissure). DIAGNOSIS  Your caregiver may suspect Crohn's disease based on your symptoms and an exam. Blood tests may confirm that there is a problem. You may be asked to submit a stool specimen for examination. X-rays and CT scans may be necessary. Ultimately, the diagnosis is usually made after a procedure that uses a flexible tube that is inserted via your mouth or your anus. This is done under  sedation and is called either an upper endoscopy or colonoscopy. With these tests, the specialist can take tiny tissue samples and remove them from the inside of the bowel (biopsy). Examination of this biopsy tissue under a microscope can reveal Crohn's disease as the cause of your symptoms. Due to the many different forms that Crohn's disease can take, symptoms may be present for several years before a diagnosis is made. TREATMENT  Medications are often used to decrease inflammation and control the immune system. These include medicines related to aspirin, steroid medications, and newer and stronger medications to slow down the immune system. Some medications may be used as suppositories or enemas. A number of other medications are used or have been studied. Your caregiver will make specific recommendations. HOME CARE INSTRUCTIONS   Symptoms such as diarrhea can be controlled with medications. Avoid foods that have a laxative effect such as fresh fruit, vegetables and dairy products. During flare ups, you can rest your bowel by refraining from solid foods. Drink clear liquids frequently during the day (electrolyte or re-hydrating fluids are best. Your caregiver can help you with suggestions). Drink often to prevent loss of body fluids (dehydration). When diarrhea has cleared, eat small meals and more frequently. Avoid food additives and stimulants such as caffeine (coffee, tea, or chocolate). Enzyme supplements may help if you develop intolerance to a sugar in dairy products (lactose). Ask your caregiver or dietitian about specific dietary instructions.  Try to maintain a positive attitude. Learn relaxation techniques such as self hypnosis, mental imaging, and muscle relaxation.  If possible, avoid stresses which can aggravate your condition.    Exercise regularly.  Follow your diet.  Always get plenty of rest. SEEK MEDICAL CARE IF:   Your symptoms fail to improve after a week or two of new  treatment.  You experience continued weight loss.  You have ongoing cramps or loose bowels.  You develop a new skin rash, skin sores, or eye problems. SEEK IMMEDIATE MEDICAL CARE IF:   You have worsening of your symptoms or develop new symptoms.  You have a fever.  You develop bloody diarrhea.  You develop severe abdominal pain. MAKE SURE YOU:   Understand these instructions.  Will watch your condition.  Will get help right away if you are not doing well or get worse. Document Released: 07/21/2005 Document Revised: 02/05/2013 Document Reviewed: 06/19/2007 [employer] Patient Information 2014 [employer].  

## 2014-01-06 NOTE — ED Provider Notes (Signed)
I saw and evaluated the patient, reviewed the resident's note and I agree with the findings and plan.   EKG Interpretation None      patient with mostly epigastric tenderness. Improved with meds here. No vomiting in ED. Will get CT and if negative discussed with GI and f/u.  Audree CamelScott T Jenia Klepper, MD 01/06/14 [phone]

## 2014-01-11 ENCOUNTER — Ambulatory Visit
Admission: RE | Admit: 2014-01-11 | Discharge: 2014-01-11 | Disposition: A | Discharge status: Home / Self Care | Payer: 59 | Source: Ambulatory Visit | Attending: Gastroenterology | Admitting: Gastroenterology

## 2014-01-11 ENCOUNTER — Ambulatory Visit: Payer: Self-pay

## 2014-01-11 ENCOUNTER — Other Ambulatory Visit: Payer: Self-pay | Admitting: Gastroenterology

## 2014-01-11 DIAGNOSIS — K529 Noninfective gastroenteritis and colitis, unspecified: Secondary | ICD-10-CM

## 2014-09-07 IMAGING — CT CT ABD-PELV W/ CM
2 of 4 series · 10 of 36 positions shown, 17 images · IV contrast (READICAT,OMNI &  & [ID] OMNI 300)
Comparison: Ultrasound of the abdomen of 06/08/2012

CLINICAL DATA: Severe lower abdominal pain for 2 weeks, some blood
in stool

EXAM:
CT ABDOMEN AND PELVIS WITH CONTRAST
TECHNIQUE: Multidetector CT imaging of the abdomen and pelvis was performed
using the standard protocol following bolus administration of
intravenous contrast.
CONTRAST:  100mL OMNIPAQUE IOHEXOL 300 MG/ML  SOLN

[Series 3: abd/pelvis with · axial · 0.70mm/px · z∈[-409,-49]mm · 9 of 90 slices shown, 15 images]
[im 9/90  soft-tissue]
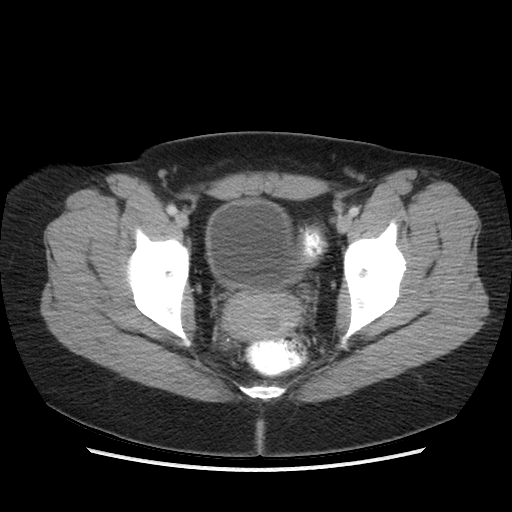
[im 9/90  bone]
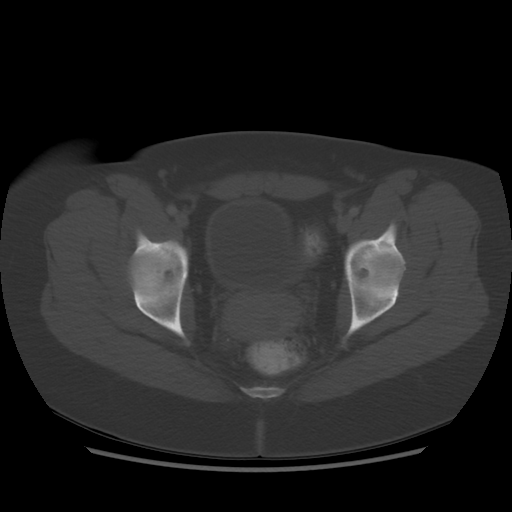
[im 18/90  soft-tissue]
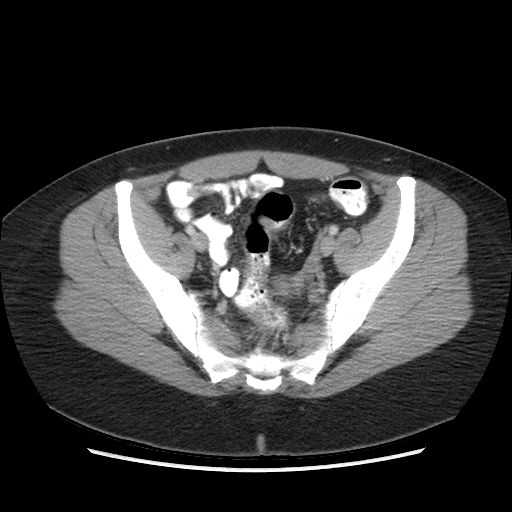
[im 27/90  soft-tissue]
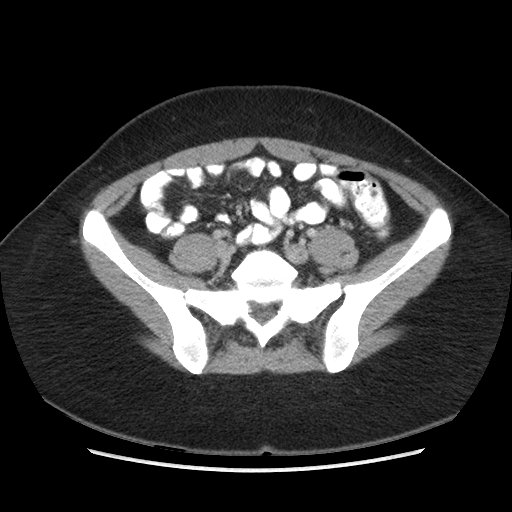
[im 36/90  soft-tissue]
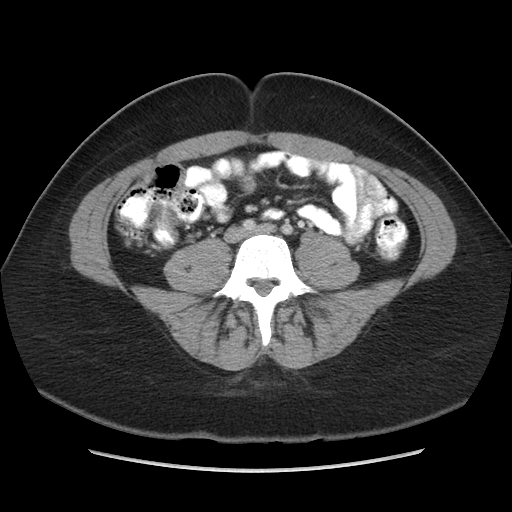
[im 45/90  soft-tissue]
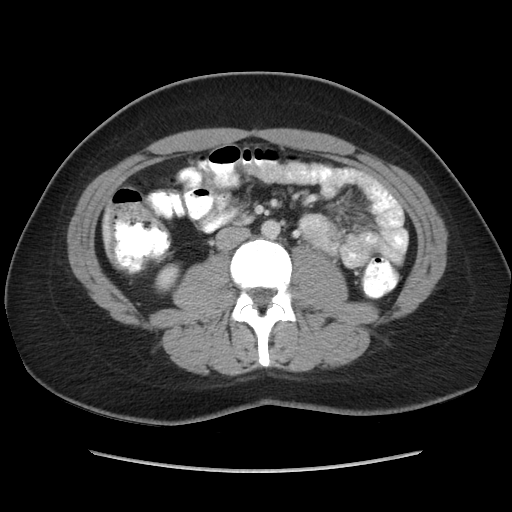
[im 54/90  soft-tissue]
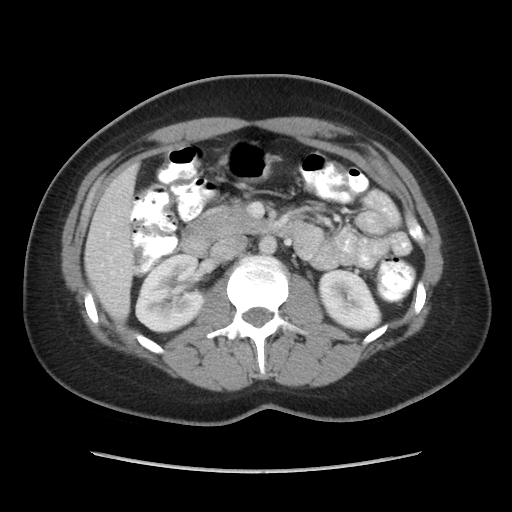
[im 54/90  lung]
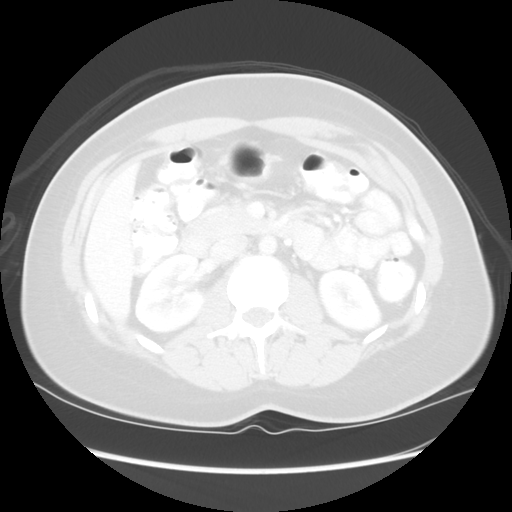
[im 63/90  soft-tissue]
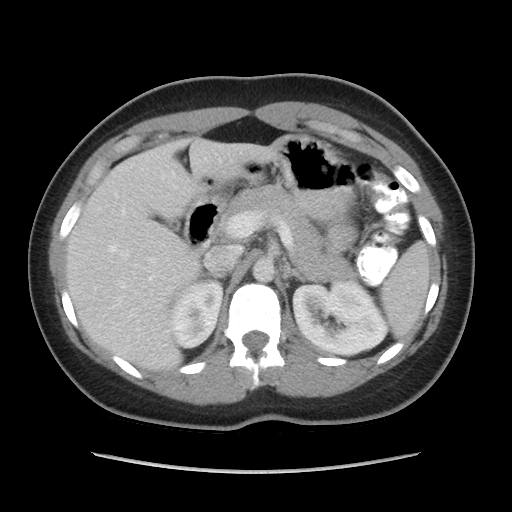
[im 63/90  lung]
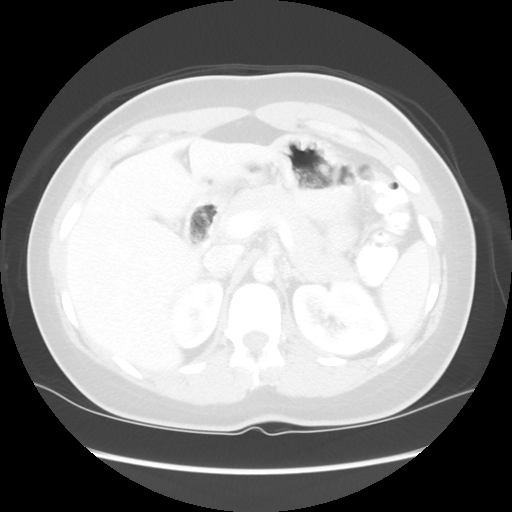
[im 72/90  soft-tissue]
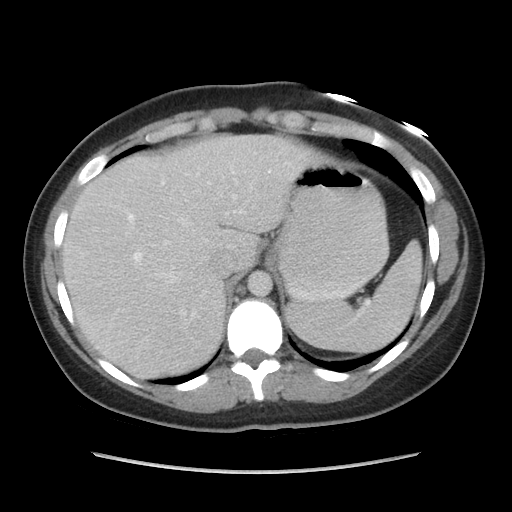
[im 72/90  lung]
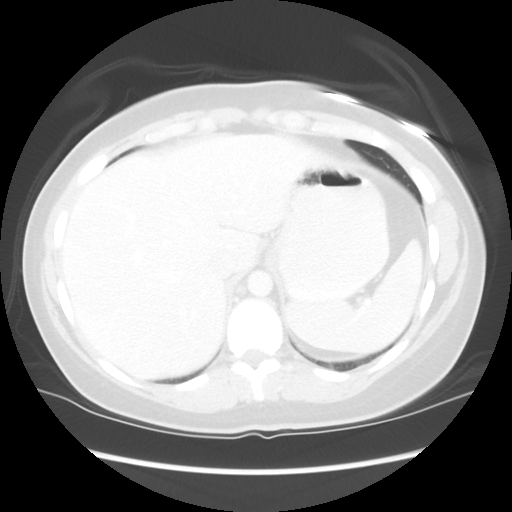
[im 81/90  soft-tissue]
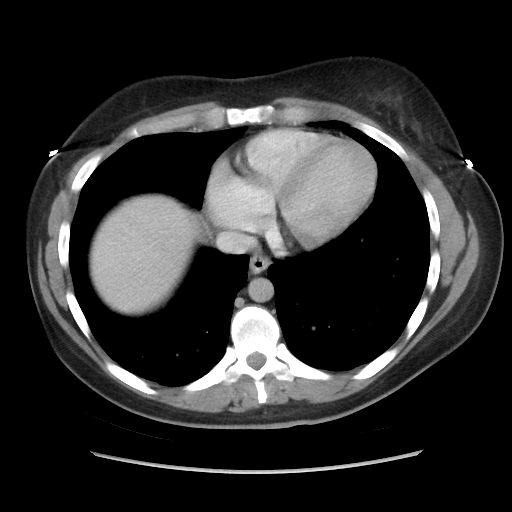
[im 81/90  lung]
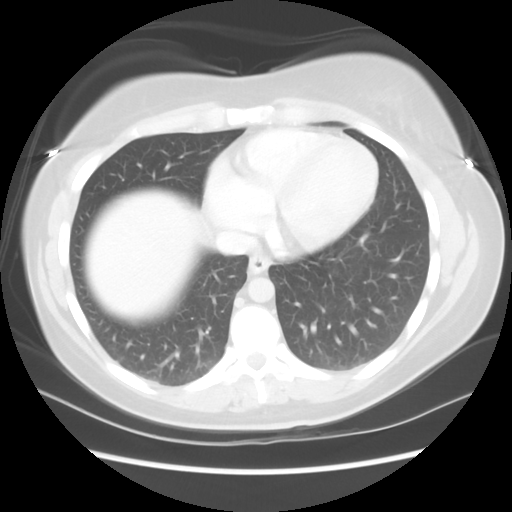
[im 81/90  bone]
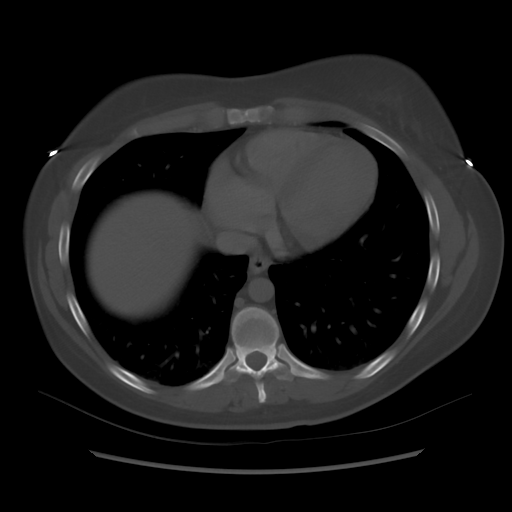

[Series 601: coronal body · coronal · 0.96mm/px · 1 of 119 slices shown, 2 images]
[im 40/119  soft-tissue]
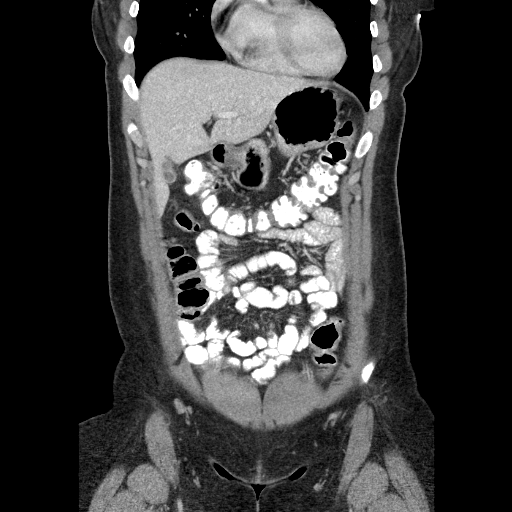
[im 40/119  bone]
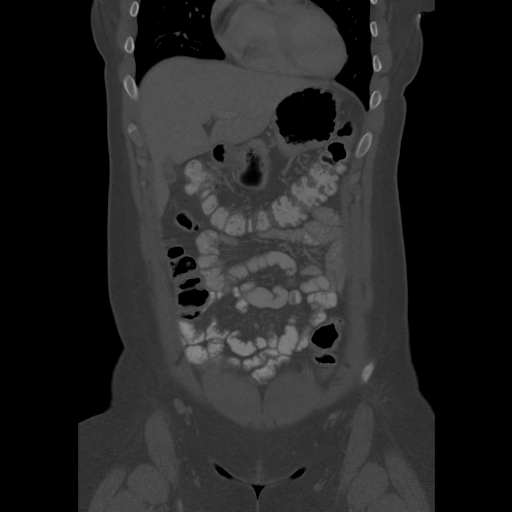

[10 of 36 positions shown; findings below may reference images not displayed]

FINDINGS: The lung bases are clear. The liver enhances with no focal
abnormality and no ductal dilatation is seen. No calcified
gallstones are noted. The pancreas is normal in size in the
pancreatic duct is not dilated. The adrenal glands and spleen are
unremarkable. The stomach is moderately fluid distended. The kidneys
enhance with no calculus or mass and there is no evidence of
hydronephrosis. The abdominal aorta is normal in caliber. No
adenopathy is seen.

The uterus is normal in size. There is a small amount of physiologic
fluid in the cul-de-sac. Probable ovarian follicles are noted. The
urinary bladder is not well distended but no abnormality is seen. No
evidence of mucosal edema within the colon or small bowel is seen.
The terminal ileum is moderately well visualized with no evidence of
inflammation. The appendix extends cephalad and appears normal. No
pelvic adenopathy is seen.
IMPRESSION: 1. No explanation for the blood in stool or abdominal pain is seen.
There is no evidence of colitis and no abnormality of small bowel is
noted.
2. Small amount of fluid in the cul de sac with probable ovarian
follicles.
